# Patient Record
Sex: Female | Born: 1990 | Race: Black or African American | Hispanic: No | Marital: Single | Smoking: Current every day smoker
Health system: Southern US, Community
[De-identification: ages and names within clinical notes are randomized; demographics above are authoritative.]

## PROBLEM LIST (undated history)

## (undated) DIAGNOSIS — S42302A Unspecified fracture of shaft of humerus, left arm, initial encounter for closed fracture: Secondary | ICD-10-CM

## (undated) HISTORY — PX: NO PAST SURGERIES: SHX2092

---

## 2016-10-01 DIAGNOSIS — S42302A Unspecified fracture of shaft of humerus, left arm, initial encounter for closed fracture: Secondary | ICD-10-CM

## 2016-10-01 HISTORY — DX: Unspecified fracture of shaft of humerus, left arm, initial encounter for closed fracture: S42.302A

## 2016-10-13 ENCOUNTER — Emergency Department (HOSPITAL_COMMUNITY)
Admission: EM | Admit: 2016-10-13 | Discharge: 2016-10-13 | Disposition: A | Discharge status: Home / Self Care | Payer: Self-pay | Attending: Emergency Medicine | Admitting: Emergency Medicine

## 2016-10-13 ENCOUNTER — Emergency Department (HOSPITAL_COMMUNITY): Payer: Self-pay

## 2016-10-13 ENCOUNTER — Encounter (HOSPITAL_COMMUNITY): Payer: Self-pay | Admitting: *Deleted

## 2016-10-13 DIAGNOSIS — S42302A Unspecified fracture of shaft of humerus, left arm, initial encounter for closed fracture: Secondary | ICD-10-CM | POA: Insufficient documentation

## 2016-10-13 DIAGNOSIS — F17200 Nicotine dependence, unspecified, uncomplicated: Secondary | ICD-10-CM | POA: Insufficient documentation

## 2016-10-13 DIAGNOSIS — Y939 Activity, unspecified: Secondary | ICD-10-CM | POA: Insufficient documentation

## 2016-10-13 DIAGNOSIS — W19XXXA Unspecified fall, initial encounter: Secondary | ICD-10-CM | POA: Insufficient documentation

## 2016-10-13 DIAGNOSIS — Y999 Unspecified external cause status: Secondary | ICD-10-CM | POA: Insufficient documentation

## 2016-10-13 DIAGNOSIS — Y92009 Unspecified place in unspecified non-institutional (private) residence as the place of occurrence of the external cause: Secondary | ICD-10-CM | POA: Insufficient documentation

## 2016-10-13 MED ORDER — OXYCODONE-ACETAMINOPHEN 5-325 MG PO TABS: 1.0000 | ORAL_TABLET | ORAL | 0 refills | Status: DC | PRN

## 2016-10-13 MED ORDER — HYDROMORPHONE HCL 1 MG/ML IJ SOLN
0.5000 mg | Freq: Once | INTRAMUSCULAR | Status: AC
  Administered 2016-10-13: 0.5 mg via INTRAMUSCULAR
  Filled 2016-10-13: qty 1

## 2016-10-13 MED ORDER — HYDROMORPHONE HCL 1 MG/ML IJ SOLN
1.0000 mg | Freq: Once | INTRAMUSCULAR | Status: AC
  Administered 2016-10-13: 1 mg via INTRAMUSCULAR
  Filled 2016-10-13: qty 1

## 2016-10-13 NOTE — Progress Notes (Signed)
Patient ID: Caitlin Reyes, female   DOB: 1990-08-02, 26 y.o.   MRN: [id number] Called to evaluate left distal 1/3 Holstein Lewis fracture   The fracture is beyond the scope of my practice for definitive fixation   According to Dr. Adriana Simasook neuro exam is normal within the parameters of the pain she is in and the exam he can perform   I recommended [facility] for [facility]   I am available for further advise if needed

## 2016-10-13 NOTE — Discharge Instructions (Signed)
Casting material, sling, ice pack, pain medication, follow-up with orthopedic doctor in the morning at 815a. Address and phone number given.

## 2016-10-13 NOTE — ED Triage Notes (Signed)
Fell at home, pain in left upper arm

## 2016-10-15 NOTE — ED Provider Notes (Signed)
[facility] DEPT Provider Note   CSN: [id number] Arrival date & time: 10/13/16  1701     History   Chief Complaint Chief Complaint  Patient presents with  . Arm Injury    HPI Caitlin Reyes is a 26 y.o. female.  Status post accidental trip and fall a brief time ago at home striking her left distal humerus. No other injuries. Severity of pain is moderate to severe. Position palpation make pain worse.      History reviewed. No pertinent past medical history.  There are no active problems to display for this patient.   History reviewed. No pertinent surgical history.  OB History    Gravida Para Term Preterm AB Living   1             SAB TAB Ectopic Multiple Live Births                   Home Medications    Prior to Admission medications   Medication Sig Start Date End Date Taking? Authorizing Provider  oxyCODONE-acetaminophen (PERCOCET) 5-325 MG tablet Take 1-2 tablets by mouth every 4 (four) hours as needed. 10/13/16   Donnetta Hutching, MD  oxyCODONE-acetaminophen (PERCOCET) 5-325 MG tablet Take 1-2 tablets by mouth every 4 (four) hours as needed. 10/13/16   Donnetta Hutching, MD    Family History No family history on file.  Social History Social History  Substance Use Topics  . Smoking status: Current Every Day Smoker  . Smokeless tobacco: Never Used  . Alcohol use Yes     Allergies   Patient has no known allergies.   Review of Systems Review of Systems  All other systems reviewed and are negative.    Physical Exam Updated Vital Signs BP 130/87 (BP Location: Right Arm)   Pulse (!) 103   Temp 98.9 F (37.2 C) (Oral)   Resp 18   Ht  (1.626 m)   Wt 57.6 kg (127 lb)   LMP 09/30/2016 (Approximate)   SpO2 98%   BMI 21.80 kg/m   Physical Exam  Constitutional: She is oriented to person, place, and time. She appears well-developed and well-nourished.  HENT:  Head: Normocephalic and atraumatic.  Eyes: Conjunctivae are normal.  Neck: Neck supple.   Cardiovascular: Normal rate and regular rhythm.   Pulmonary/Chest: Effort normal and breath sounds normal.  Abdominal: Soft. Bowel sounds are normal.  Musculoskeletal:  Tender left distal humerus  Neurological: She is alert and oriented to person, place, and time.  Skin: Skin is warm and dry.  Psychiatric: She has a normal mood and affect. Her behavior is normal.  Nursing note and vitals reviewed.    ED Treatments / Results  Labs (all labs ordered are listed, but only abnormal results are displayed) Labs Reviewed - No data to display  EKG  EKG Interpretation None       Radiology Dg Humerus Left  Result Date: 10/13/2016 CLINICAL DATA:  Patient fell and landed on left humerus.  Pain. EXAM: LEFT HUMERUS - 2+ VIEW COMPARISON:  None. FINDINGS: There is an acute, closed, oblique fracture of the left humerus at the junction of the middle and distal third. There is 1 shaft width lateral displacement of the distal fracture fragment on the AP view with lateral angulation seen on the additional view provide. A transthoracic view is also provided which is of limited utility. No malalignment of the included AC and glenohumeral joint. The elbow joint is maintained as well. IMPRESSION: Acute, closed oblique  fracture of the left humerus at junction of the middle and distal third with lateral displacement on the AP view and lateral angulation on the additional images provided. Electronically Signed   By: Tollie Eth M.D.   On: 10/13/2016 19:03    Procedures Procedures (including critical care time)  Medications Ordered in ED Medications  HYDROmorphone (DILAUDID) injection 0.5 mg (0.5 mg Intramuscular Given 10/13/16 1809)  HYDROmorphone (DILAUDID) injection 1 mg (1 mg Intramuscular Given 10/13/16 2058)     Initial Impression / Assessment and Plan / ED Course  I have reviewed the triage vital signs and the nursing notes.  Pertinent labs & imaging results that were available during my care of  the patient were reviewed by me and considered in my medical decision making (see chart for details).     Patient has a displaced oblique fracture of the left distal humerus. Discussed with orthopedic surgeon in [place]. Will splint, treat pain;  patient will be reevaluated in the morning by orthopedic surgery  Final Clinical Impressions(s) / ED Diagnoses   Final diagnoses:  Closed fracture of shaft of left humerus, unspecified fracture morphology, initial encounter    New Prescriptions Discharge Medication List as of 10/13/2016  9:31 PM    START taking these medications   Details  !! oxyCODONE-acetaminophen (PERCOCET) 5-325 MG tablet Take 1-2 tablets by mouth every 4 (four) hours as needed., Starting Thu 10/13/2016, Print    !! oxyCODONE-acetaminophen (PERCOCET) 5-325 MG tablet Take 1-2 tablets by mouth every 4 (four) hours as needed., Starting Thu 10/13/2016, Print     !! - Potential duplicate medications found. Please discuss with provider.       Donnetta Hutching, MD 10/15/16 1137

## 2016-10-17 NOTE — H&P (Signed)
PREOPERATIVE H&P  Chief Complaint: left humeral shaft fracture  HPI: Caitlin Reyes is a 26 y.o. female who presents for preoperative history and physical with a diagnosis of left humeral shaft fracture. Symptoms are rated as moderate to severe, and have been worsening.  This is significantly impairing activities of daily living.  She has elected for surgical management.   No past medical history on file. No past surgical history on file. Social History   Social History  . Marital status: Single    Spouse name: N/A  . Number of children: N/A  . Years of education: N/A   Social History Main Topics  . Smoking status: Current Every Day Smoker  . Smokeless tobacco: Never Used  . Alcohol use Yes  . Drug use: No  . Sexual activity: Not on file   Other Topics Concern  . Not on file   Social History Narrative  . No narrative on file   No family history on file. No Known Allergies Prior to Admission medications   Medication Sig Start Date End Date Taking? Authorizing Provider  oxyCODONE-acetaminophen (PERCOCET) 5-325 MG tablet Take 1-2 tablets by mouth every 4 (four) hours as needed. 10/13/16   Donnetta Hutching, MD  oxyCODONE-acetaminophen (PERCOCET) 5-325 MG tablet Take 1-2 tablets by mouth every 4 (four) hours as needed. 10/13/16   Donnetta Hutching, MD     Positive ROS: All other systems have been reviewed and were otherwise negative with the exception of those mentioned in the HPI and as above.  Physical Exam: General: Alert, no acute distress Cardiovascular: No pedal edema Respiratory: No cyanosis, no use of accessory musculature GI: No organomegaly, abdomen is soft and non-tender Skin: No lesions in the area of chief complaint Neurologic: Sensation intact distally Psychiatric: Patient is competent for consent with normal mood and affect Lymphatic: No axillary or cervical lymphadenopathy  MUSCULOSKELETAL: Splint CDI. Skin intact though cannot assess fully beneath splint. Nontender to  palpation proximally, with full and painless ROM throughout hand with DPC of 0. + Motor in  AIN, PIN, Ulnar distributions. Sensation intact in medial, radial, and ulnar distributions. Well perfused digits.    Assessment: left humeral shaft fracture  Plan: Plan for Procedure(s): OPEN REDUCTION INTERNAL FIXATION (ORIF) HUMERAL SHAFT FRACTURE  The risks benefits and alternatives were discussed with the patient including but not limited to the risks of nonoperative treatment, versus surgical intervention including infection, bleeding, nerve injury,  blood clots, cardiopulmonary complications, morbidity, mortality, among others, and they were willing to proceed.   Bjorn Pippin, MD  10/17/2016 8:31 AM

## 2016-10-18 ENCOUNTER — Encounter (HOSPITAL_BASED_OUTPATIENT_CLINIC_OR_DEPARTMENT_OTHER): Payer: Self-pay | Admitting: *Deleted

## 2016-10-19 ENCOUNTER — Ambulatory Visit (HOSPITAL_BASED_OUTPATIENT_CLINIC_OR_DEPARTMENT_OTHER)
Admission: RE | Admit: 2016-10-19 | Discharge: 2016-10-19 | Disposition: A | Discharge status: Home / Self Care | Payer: Self-pay | Source: Ambulatory Visit | Attending: Orthopaedic Surgery | Admitting: Orthopaedic Surgery

## 2016-10-19 ENCOUNTER — Ambulatory Visit (HOSPITAL_BASED_OUTPATIENT_CLINIC_OR_DEPARTMENT_OTHER): Payer: Self-pay | Admitting: Certified Registered"

## 2016-10-19 ENCOUNTER — Encounter (HOSPITAL_BASED_OUTPATIENT_CLINIC_OR_DEPARTMENT_OTHER)
Admission: RE | Disposition: A | Discharge status: Home / Self Care | Payer: Self-pay | Source: Ambulatory Visit | Attending: Orthopaedic Surgery

## 2016-10-19 ENCOUNTER — Encounter (HOSPITAL_BASED_OUTPATIENT_CLINIC_OR_DEPARTMENT_OTHER): Payer: Self-pay | Admitting: Certified Registered"

## 2016-10-19 DIAGNOSIS — W109XXA Fall (on) (from) unspecified stairs and steps, initial encounter: Secondary | ICD-10-CM | POA: Insufficient documentation

## 2016-10-19 DIAGNOSIS — S42412A Displaced simple supracondylar fracture without intercondylar fracture of left humerus, initial encounter for closed fracture: Secondary | ICD-10-CM | POA: Insufficient documentation

## 2016-10-19 DIAGNOSIS — G5632 Lesion of radial nerve, left upper limb: Secondary | ICD-10-CM | POA: Insufficient documentation

## 2016-10-19 DIAGNOSIS — F17200 Nicotine dependence, unspecified, uncomplicated: Secondary | ICD-10-CM | POA: Insufficient documentation

## 2016-10-19 HISTORY — PX: ORIF HUMERUS FRACTURE: SHX2126

## 2016-10-19 HISTORY — DX: Unspecified fracture of shaft of humerus, left arm, initial encounter for closed fracture: S42.302A

## 2016-10-19 SURGERY — OPEN REDUCTION INTERNAL FIXATION (ORIF) HUMERAL SHAFT FRACTURE
Anesthesia: General | Site: Arm Upper | Laterality: Left

## 2016-10-19 MED ORDER — OXYCODONE HCL 5 MG PO TABS: ORAL_TABLET | ORAL | 0 refills | Status: AC

## 2016-10-19 MED ORDER — CEFAZOLIN SODIUM-DEXTROSE 2-4 GM/100ML-% IV SOLN
INTRAVENOUS | Status: AC
  Filled 2016-10-19: qty 100

## 2016-10-19 MED ORDER — OXYCODONE HCL 5 MG/5ML PO SOLN: 5.0000 mg | Freq: Once | ORAL | Status: DC | PRN

## 2016-10-19 MED ORDER — CHLORHEXIDINE GLUCONATE 4 % EX LIQD: 60.0000 mL | Freq: Once | CUTANEOUS | Status: DC

## 2016-10-19 MED ORDER — MIDAZOLAM HCL 2 MG/2ML IJ SOLN
INTRAMUSCULAR | Status: AC
  Filled 2016-10-19: qty 2

## 2016-10-19 MED ORDER — MEPERIDINE HCL 25 MG/ML IJ SOLN: 6.2500 mg | INTRAMUSCULAR | Status: DC | PRN

## 2016-10-19 MED ORDER — FENTANYL CITRATE (PF) 100 MCG/2ML IJ SOLN
INTRAMUSCULAR | Status: AC
  Filled 2016-10-19: qty 2

## 2016-10-19 MED ORDER — BUPIVACAINE-EPINEPHRINE (PF) 0.5% -1:200000 IJ SOLN
INTRAMUSCULAR | Status: DC | PRN
  Administered 2016-10-19: 30 mL via PERINEURAL

## 2016-10-19 MED ORDER — LIDOCAINE HCL (CARDIAC) 20 MG/ML IV SOLN
INTRAVENOUS | Status: DC | PRN
  Administered 2016-10-19: 60 mg via INTRAVENOUS

## 2016-10-19 MED ORDER — SCOPOLAMINE 1 MG/3DAYS TD PT72: 1.0000 | MEDICATED_PATCH | Freq: Once | TRANSDERMAL | Status: DC | PRN

## 2016-10-19 MED ORDER — ROCURONIUM BROMIDE 100 MG/10ML IV SOLN
INTRAVENOUS | Status: DC | PRN
  Administered 2016-10-19: 20 mg via INTRAVENOUS
  Administered 2016-10-19: 50 mg via INTRAVENOUS

## 2016-10-19 MED ORDER — DEXAMETHASONE SODIUM PHOSPHATE 4 MG/ML IJ SOLN
INTRAMUSCULAR | Status: DC | PRN
  Administered 2016-10-19: 10 mg via INTRAVENOUS

## 2016-10-19 MED ORDER — DEXAMETHASONE SODIUM PHOSPHATE 10 MG/ML IJ SOLN
INTRAMUSCULAR | Status: AC
  Filled 2016-10-19: qty 2

## 2016-10-19 MED ORDER — BUPIVACAINE-EPINEPHRINE (PF) 0.5% -1:200000 IJ SOLN
INTRAMUSCULAR | Status: AC
  Filled 2016-10-19: qty 30

## 2016-10-19 MED ORDER — PROPOFOL 10 MG/ML IV BOLUS
INTRAVENOUS | Status: DC | PRN
  Administered 2016-10-19: 200 mg via INTRAVENOUS

## 2016-10-19 MED ORDER — ONDANSETRON HCL 4 MG PO TABS: 4.0000 mg | ORAL_TABLET | Freq: Three times a day (TID) | ORAL | 1 refills | Status: AC | PRN

## 2016-10-19 MED ORDER — BUPIVACAINE HCL (PF) 0.5 % IJ SOLN
INTRAMUSCULAR | Status: AC
  Filled 2016-10-19: qty 30

## 2016-10-19 MED ORDER — HYDROMORPHONE HCL 1 MG/ML IJ SOLN
0.2500 mg | INTRAMUSCULAR | Status: DC | PRN
  Administered 2016-10-19: 0.5 mg via INTRAVENOUS

## 2016-10-19 MED ORDER — BUPIVACAINE HCL (PF) 0.5 % IJ SOLN
INTRAMUSCULAR | Status: DC | PRN
  Administered 2016-10-19: 20 mL

## 2016-10-19 MED ORDER — NAPROXEN 250 MG PO TABS: 250.0000 mg | ORAL_TABLET | Freq: Two times a day (BID) | ORAL | 0 refills | Status: AC

## 2016-10-19 MED ORDER — OXYCODONE HCL 5 MG PO TABS: 5.0000 mg | ORAL_TABLET | Freq: Once | ORAL | Status: DC | PRN

## 2016-10-19 MED ORDER — FENTANYL CITRATE (PF) 100 MCG/2ML IJ SOLN
50.0000 ug | INTRAMUSCULAR | Status: AC | PRN
  Administered 2016-10-19 (×2): 50 ug via INTRAVENOUS
  Administered 2016-10-19: 100 ug via INTRAVENOUS

## 2016-10-19 MED ORDER — ONDANSETRON HCL 4 MG/2ML IJ SOLN
INTRAMUSCULAR | Status: AC
  Filled 2016-10-19: qty 10

## 2016-10-19 MED ORDER — MIDAZOLAM HCL 2 MG/2ML IJ SOLN
2.0000 mg | Freq: Once | INTRAMUSCULAR | Status: AC
  Administered 2016-10-19: 2 mg via INTRAVENOUS

## 2016-10-19 MED ORDER — BISACODYL 5 MG PO TBEC: 5.0000 mg | DELAYED_RELEASE_TABLET | Freq: Every day | ORAL | 0 refills | Status: AC | PRN

## 2016-10-19 MED ORDER — MIDAZOLAM HCL 2 MG/2ML IJ SOLN
1.0000 mg | INTRAMUSCULAR | Status: DC | PRN
  Administered 2016-10-19: 2 mg via INTRAVENOUS

## 2016-10-19 MED ORDER — PROMETHAZINE HCL 25 MG/ML IJ SOLN: 6.2500 mg | INTRAMUSCULAR | Status: DC | PRN

## 2016-10-19 MED ORDER — HYDROMORPHONE HCL 1 MG/ML IJ SOLN
INTRAMUSCULAR | Status: AC
  Filled 2016-10-19: qty 0.5

## 2016-10-19 MED ORDER — LACTATED RINGERS IV SOLN
INTRAVENOUS | Status: DC
  Administered 2016-10-19 (×2): via INTRAVENOUS

## 2016-10-19 MED ORDER — OMEPRAZOLE 20 MG PO CPDR: 20.0000 mg | DELAYED_RELEASE_CAPSULE | Freq: Every day | ORAL | 0 refills | Status: DC

## 2016-10-19 MED ORDER — ONDANSETRON HCL 4 MG/2ML IJ SOLN
INTRAMUSCULAR | Status: DC | PRN
  Administered 2016-10-19: 4 mg via INTRAVENOUS

## 2016-10-19 MED ORDER — CEFAZOLIN SODIUM-DEXTROSE 2-4 GM/100ML-% IV SOLN
2.0000 g | INTRAVENOUS | Status: AC
  Administered 2016-10-19: 2 g via INTRAVENOUS

## 2016-10-19 MED ORDER — PROPOFOL 500 MG/50ML IV EMUL
INTRAVENOUS | Status: AC
  Filled 2016-10-19: qty 50

## 2016-10-19 MED ORDER — LIDOCAINE 2% (20 MG/ML) 5 ML SYRINGE
INTRAMUSCULAR | Status: AC
  Filled 2016-10-19: qty 15

## 2016-10-19 MED ORDER — SUGAMMADEX SODIUM 500 MG/5ML IV SOLN
INTRAVENOUS | Status: DC | PRN
  Administered 2016-10-19: 250 mg via INTRAVENOUS

## 2016-10-19 MED ORDER — ACETAMINOPHEN 500 MG PO TABS: 1000.0000 mg | ORAL_TABLET | Freq: Three times a day (TID) | ORAL | 0 refills | Status: AC

## 2016-10-19 SURGICAL SUPPLY — 78 items
BANDAGE ACE 4X5 VEL STRL LF (GAUZE/BANDAGES/DRESSINGS) ×2 IMPLANT
BENZOIN TINCTURE PRP APPL 2/3 (GAUZE/BANDAGES/DRESSINGS) IMPLANT
BIT DRILL 2.5X110 QC LCP DISP (BIT) ×4 IMPLANT
BIT DRILL QC 3.5X110 (BIT) ×2 IMPLANT
BLADE CLIPPER SURG (BLADE) IMPLANT
BLADE SURG 15 STRL LF DISP TIS (BLADE) ×1 IMPLANT
BLADE SURG 15 STRL SS (BLADE) ×1
BNDG ESMARK 4X9 LF (GAUZE/BANDAGES/DRESSINGS) ×2 IMPLANT
CHLORAPREP W/TINT 26ML (MISCELLANEOUS) ×4 IMPLANT
COVER BACK TABLE 60X90IN (DRAPES) IMPLANT
COVER MAYO STAND STRL (DRAPES) ×4 IMPLANT
COVER SURGICAL LIGHT HANDLE (MISCELLANEOUS) ×2 IMPLANT
DECANTER SPIKE VIAL GLASS SM (MISCELLANEOUS) IMPLANT
DRAPE C-ARM 42X72 X-RAY (DRAPES) IMPLANT
DRAPE EXTREMITY T 121X128X90 (DRAPE) ×2 IMPLANT
DRAPE IMP U-DRAPE 54X76 (DRAPES) IMPLANT
DRAPE INCISE IOBAN 66X45 STRL (DRAPES) ×6 IMPLANT
DRAPE OEC MINIVIEW 54X84 (DRAPES) ×2 IMPLANT
DRAPE U-SHAPE 47X51 STRL (DRAPES) ×2 IMPLANT
DRAPE U-SHAPE 76X120 STRL (DRAPES) IMPLANT
ELECT REM PT RETURN 9FT ADLT (ELECTROSURGICAL) ×2
ELECTRODE REM PT RTRN 9FT ADLT (ELECTROSURGICAL) ×1 IMPLANT
GAUZE SPONGE 4X4 12PLY STRL (GAUZE/BANDAGES/DRESSINGS) IMPLANT
GAUZE XEROFORM 1X8 LF (GAUZE/BANDAGES/DRESSINGS) IMPLANT
GAUZE XEROFORM 5X9 LF (GAUZE/BANDAGES/DRESSINGS) ×2 IMPLANT
GLOVE BIOGEL PI IND STRL 7.0 (GLOVE) ×2 IMPLANT
GLOVE BIOGEL PI IND STRL 8 (GLOVE) ×2 IMPLANT
GLOVE BIOGEL PI INDICATOR 7.0 (GLOVE) ×2
GLOVE BIOGEL PI INDICATOR 8 (GLOVE) ×2
GLOVE ECLIPSE 6.5 STRL STRAW (GLOVE) ×4 IMPLANT
GLOVE SURG ORTHO 8.0 STRL STRW (GLOVE) ×2 IMPLANT
GOWN STRL REUS W/ TWL LRG LVL3 (GOWN DISPOSABLE) ×2 IMPLANT
GOWN STRL REUS W/TWL LRG LVL3 (GOWN DISPOSABLE) ×2
GOWN STRL REUS W/TWL XL LVL3 (GOWN DISPOSABLE) ×2 IMPLANT
NS IRRIG 1000ML POUR BTL (IV SOLUTION) ×2 IMPLANT
PACK ARTHROSCOPY DSU (CUSTOM PROCEDURE TRAY) ×2 IMPLANT
PACK BASIN DAY SURGERY FS (CUSTOM PROCEDURE TRAY) ×2 IMPLANT
PAD CAST 4YDX4 CTTN HI CHSV (CAST SUPPLIES) ×2 IMPLANT
PADDING CAST COTTON 4X4 STRL (CAST SUPPLIES) ×2
PENCIL BUTTON HOLSTER BLD 10FT (ELECTRODE) ×2 IMPLANT
PLATE DIS HUM LT 3.5 8H STRL (Plate) ×2 IMPLANT
SCREW CORTEX 3.5 20MM (Screw) ×2 IMPLANT
SCREW CORTEX 3.5 22MM (Screw) ×1 IMPLANT
SCREW CORTEX 3.5 24MM (Screw) ×3 IMPLANT
SCREW LOCK CORT ST 3.5X20 (Screw) ×2 IMPLANT
SCREW LOCK CORT ST 3.5X22 (Screw) ×1 IMPLANT
SCREW LOCK CORT ST 3.5X24 (Screw) ×3 IMPLANT
SCREW LOCK T15 FT 18X3.5X2.9X (Screw) ×1 IMPLANT
SCREW LOCK T15 FT 28X3.5X2.9X (Screw) ×2 IMPLANT
SCREW LOCKING 3.5X18 (Screw) ×1 IMPLANT
SCREW LOCKING 3.5X28 (Screw) ×2 IMPLANT
SLEEVE SCD COMPRESS KNEE MED (MISCELLANEOUS) ×2 IMPLANT
SLING ARM FOAM STRAP LRG (SOFTGOODS) ×2 IMPLANT
SLING ARM IMMOBILIZER LRG (SOFTGOODS) IMPLANT
SLING ARM IMMOBILIZER MED (SOFTGOODS) IMPLANT
SLING ARM MED ADULT FOAM STRAP (SOFTGOODS) IMPLANT
SLING ARM XL FOAM STRAP (SOFTGOODS) IMPLANT
SPLINT FAST PLASTER 5X30 (CAST SUPPLIES) ×10
SPLINT PLASTER CAST FAST 5X30 (CAST SUPPLIES) ×10 IMPLANT
SPONGE LAP 18X18 X RAY DECT (DISPOSABLE) ×4 IMPLANT
STAPLER VISISTAT 35W (STAPLE) ×2 IMPLANT
STRIP CLOSURE SKIN 1/2X4 (GAUZE/BANDAGES/DRESSINGS) IMPLANT
SUCTION FRAZIER HANDLE 10FR (MISCELLANEOUS) ×1
SUCTION TUBE FRAZIER 10FR DISP (MISCELLANEOUS) ×1 IMPLANT
SUT ETHILON 3 0 PS 1 (SUTURE) IMPLANT
SUT FIBERWIRE #2 38 T-5 BLUE (SUTURE)
SUT VIC AB 0 CT1 27 (SUTURE) ×1
SUT VIC AB 0 CT1 27XBRD ANBCTR (SUTURE) ×1 IMPLANT
SUT VIC AB 2-0 SH 27 (SUTURE) ×1
SUT VIC AB 2-0 SH 27XBRD (SUTURE) ×1 IMPLANT
SUT VIC AB 3-0 SH 27 (SUTURE) ×1
SUT VIC AB 3-0 SH 27X BRD (SUTURE) ×1 IMPLANT
SUTURE FIBERWR #2 38 T-5 BLUE (SUTURE) IMPLANT
SYR BULB 3OZ (MISCELLANEOUS) IMPLANT
SYR BULB IRRIGATION 50ML (SYRINGE) ×4 IMPLANT
TOWEL OR 17X24 6PK STRL BLUE (TOWEL DISPOSABLE) ×2 IMPLANT
TOWEL OR NON WOVEN STRL DISP B (DISPOSABLE) ×2 IMPLANT
YANKAUER SUCT BULB TIP NO VENT (SUCTIONS) ×2 IMPLANT

## 2016-10-19 NOTE — Transfer of Care (Signed)
Immediate Anesthesia Transfer of Care Note  Patient: Caitlin Reyes  Procedure(s) Performed: Procedure(s): OPEN REDUCTION INTERNAL FIXATION (ORIF) HUMERAL SHAFT FRACTURE (Left)  Patient Location: PACU  Anesthesia Type:General  Level of Consciousness: awake, alert , oriented and patient cooperative  Airway & Oxygen Therapy: Patient Spontanous Breathing and Patient connected to face mask oxygen  Post-op Assessment: Report given to RN and Post -op Vital signs reviewed and stable  Post vital signs: Reviewed and stable  Last Vitals:  Vitals:   10/19/16 1045 10/19/16 1046  BP:  (!) 143/89  Pulse: (!) 101 (!) 106  Resp:  (!) 21  Temp:    SpO2: 100% 100%    Last Pain:  Vitals:   10/19/16 0705  TempSrc: Oral  PainSc: 0-No pain         Complications: No apparent anesthesia complications

## 2016-10-19 NOTE — Progress Notes (Signed)
Assisted Dr. Germeroth with left, ultrasound guided, supraclavicular block. Side rails up, monitors on throughout procedure. See vital signs in flow sheet. Tolerated Procedure well. 

## 2016-10-19 NOTE — Anesthesia Postprocedure Evaluation (Signed)
Anesthesia Post Note  Patient: Caitlin Reyes  Procedure(s) Performed: Procedure(s) (LRB): OPEN REDUCTION INTERNAL FIXATION (ORIF) HUMERAL SHAFT FRACTURE (Left)     Patient location during evaluation: PACU Anesthesia Type: General Level of consciousness: sedated and patient cooperative Pain management: pain level controlled Vital Signs Assessment: post-procedure vital signs reviewed and stable Respiratory status: spontaneous breathing Cardiovascular status: stable Anesthetic complications: no Comments: Post op block in PACU    Last Vitals:  Vitals:   10/19/16 1218 10/19/16 1225  BP: (!) 174/115 (!) 174/109  Pulse: 99   Resp: 18   Temp: 36.8 C   SpO2: 100%     Last Pain:  Vitals:   10/19/16 1218  TempSrc:   PainSc: 0-No pain                 Lewie Loron

## 2016-10-19 NOTE — Op Note (Addendum)
Orthopaedic Surgery Operative Note (CSN: [id number])  Caitlin Reyes  24-Sep-1990 Date of Surgery: 10/19/2016 Admit Date: 10/19/2016  Diagnoses:  Spiral supracondylar humerus fracture    Post-Op Diagnosis: Same  Procedures:   * OPEN REDUCTION INTERNAL FIXATION (ORIF) HUMERAL SHAFT FRACTURE 24545    * RADIAL NERVE NEUROLYSIS CPT 64748   Operative Finding Successful completion of planned procedure. Fracture was at the level of the radial nerve and this was neurolysed proximally and distally to free it of tether. Lag screws were placed as well as a neutralization plate due to the long spiral nature of the fracture  Post-operative plan: The patient will be nonweightbearing in the splint until her follow-up.  The patient will be discharged home.  DVT prophylaxis is not indicated in this angulatory patient with isolated upper trauma surgery.  Pain control with PRN pain medication preferring oral medicines.  Follow up plan will be scheduled in approximately 10-14 days for wound check .  Surgeons:Primary: Bjorn Pippin, MD  Assist: Janace Litten OPA Location: [facility] OR ROOM 5 Anesthesia: General Antibiotics: Ancef 2g preop Tourniquet time:  Total Tourniquet Time Documented: Upper Arm (Left) - 22 minutes Total: Upper Arm (Left) - 22 minutes  Estimated Blood Loss: Approximately 200 Complications: None Specimens: None Implants:  Implant Name Type Inv. Item Serial No. Manufacturer Lot No. LRB No. Used Action  3.5 LCP extra articular distal humerus plate    [employer] TRAUMA [id number] Left 1 Implanted  SCREW LOCKING 3.5X28 - [id number] Screw SCREW LOCKING 3.5X28  SYN[employer] TRAUMA ON STERILE TRAY Left 2 Implanted  SCREW CORTEX 3.5 - [id number] Screw SCREW CORTEX 3.5  SYNTHES TRAUMA ON STERILE TRAY Left 1 Implanted  SCREW LOCKING 3.5X18 - [id number] Screw SCREW LOCKING 3.5X18  SYNTHES TRAUMA ON STERILE TRAY Left 1 Implanted  SCREW CORTEX 3.5 - [id number] Screw SCREW CORTEX 3.5  SYNTHES  TRAUMA ON STERILE TRAY Left 2 Implanted  SCREW CORTEX 3.5 - [id number] Screw SCREW CORTEX 3.5   SYNTHES TRAUMA ON STERILE TRAY Left 3 Implanted    Indications for Surgery:   Caitlin Reyes is a 26 y.o. female with Fall down stairs resulting in an oblique spiral displaced left supracondylar humerus fracture.  This had a resultant radial nerve palsy though incomplete. Due to the displaced nature of this fracture in this otherwise healthy patient discussed surgical management versus nonsurgical management and she elected to proceed with surgery. Benefits and risks of operative and nonoperative management were discussed prior to surgery with patient/guardian(s) and informed consent form was completed.     Procedure:   The patient was identified in the preoperative holding area where the surgical site was marked. The patient was taken to the OR where a procedural timeout was called and the above noted anesthesia was induced.  Preoperative antibiotics were dosed.  Patient was positioned lateral on a beanbag with the arm over a bolster. The patient's left humerus was prepped and draped in the usual sterile fashion.  A second preoperative timeout was called.      A tourniquet was used for the above listed time.   We initially proceeded with a sterile tourniquet in place but removed it during our initial dissection. We made a longitudinal incision down the midline of the posterior aspect of the humerus achieving hemostasis as we progressed with Bovie electrocautery. We encountered the fascia over the triceps and this is open. We then identified the lateral border of the triceps as it inserted to the olecranon  followed this proximally mobilizing the triceps without having to release its distal insertion. We're able to use this window and dissected carefully identifying the fracture.  At the level of the fracture the radial nerve was crossing from medial to lateral on the posterior aspect of the  humerus. This was accompanied by the artery as well. We took great care to protect the structures using [employer] retractors and gently mobilized it. A neurolysis was performed proximal and distally to allow the nerve to be free for further fixation of the fracture.  We then turned our attention fracture fixation. Callus was debrided from the fracture location and we were able to achieve an anatomic reduction and hold it in place provisionally with point when clamps. This point fluoroscopic images were used to gauge our reduction. We then placed 2 screws in lag by technique fashion across the fracture site and again checked our reduction. Once were happy with the reduction we assessed her fracture for a neutralization plate. Due to the long oblique nature of the fracture it was determined that a extra articular distal humerus plate from [employer] was the appropriate fixation. We positioned the plate by carefully sliding it under the neurovascular bundle against the bone and checked its location with fluoroscopy for obtaining for distal screws with bicortical purchase and 3 proximal screws bicortical purchase.  The screws were noted to be out of the zone of injury of the bone.  We then again checked the status of the radial nerve and artery and noted that there were both intact at the completion of the case.  The wound was thoroughly irrigated.  The wound was closed in a multilayer fashion. A sterile dressing and splint were placed.  The patient was awoken from general anesthesia and taken to the PACU in stable condition without complication.   Janace Litten, OPA-C, present and scrubbed throughout the case, critical for completion in a timely fashion, and for retraction, instrumentation, closure.

## 2016-10-19 NOTE — Interval H&P Note (Signed)
History and Physical Interval Note:  10/19/2016 7:58 AM  Caitlin Reyes  has presented today for surgery, with the diagnosis of left humeral shaft fracture  The various methods of treatment have been discussed with the patient and family. After consideration of risks, benefits and other options for treatment, the patient has consented to  Procedure(s): OPEN REDUCTION INTERNAL FIXATION (ORIF) HUMERAL SHAFT FRACTURE (Left) as a surgical intervention .  The patient's history has been reviewed, patient examined, no change in status, stable for surgery.  I have reviewed the patient's chart and labs.  Questions were answered to the patient's satisfaction.    Patient continues to have some parasthesias in the radial nerve distribution.  We discussed avoiding a block in this setting with anesthesia.  Additionally we warned the patient that her radial nerve parasthesias may be increased postop.  She expressed understanding.   Bjorn Pippin

## 2016-10-19 NOTE — Anesthesia Procedure Notes (Signed)
Procedure Name: Intubation Date/Time: 10/19/2016 8:24 AM Performed by: Victor Langenbach D Pre-anesthesia Checklist: Patient identified, Emergency Drugs available, Suction available and Patient being monitored Patient Re-evaluated:Patient Re-evaluated prior to induction Oxygen Delivery Method: Circle system utilized Preoxygenation: Pre-oxygenation with 100% oxygen Induction Type: IV induction Ventilation: Mask ventilation without difficulty Laryngoscope Size: Mac and 3 Grade View: Grade I Tube type: Oral Number of attempts: 1 Airway Equipment and Method: Stylet and Oral airway Placement Confirmation: ETT inserted through vocal cords under direct vision,  positive ETCO2 and breath sounds checked- equal and bilateral Secured at: 21 cm Tube secured with: Tape Dental Injury: Teeth and Oropharynx as per pre-operative assessment

## 2016-10-19 NOTE — Anesthesia Procedure Notes (Signed)
Anesthesia Regional Block: Supraclavicular block   Pre-Anesthetic Checklist: ,, timeout performed, Correct Patient, Correct Site, Correct Laterality, Correct Procedure, Correct Position, site marked, Risks and benefits discussed,  Surgical consent,  Pre-op evaluation,  At surgeon's request and post-op pain management  Laterality: Left  Prep: chloraprep       Needles:  Injection technique: Single-shot  Needle Type: Stimiplex          Additional Needles:   Procedures:,,,, ultrasound used (permanent image in chart),,,,  Narrative:  Start time: 10/19/2016 11:13 AM End time: 10/19/2016 11:23 AM Injection made incrementally with aspirations every 5 mL.  Performed by: Personally  Anesthesiologist: Lewie Loron  Additional Notes: Dr. Everardo Pacific request no preop block to allow for evaluation of neurologic status in PACU. Discussed post op block with patient. Risks, benefits and alternative to block explained extensively. Dr. Everardo Pacific at bedside in PACU to eval pt prior to block. She had gross motor function of her left thumb. No strength assessment, just able to move thumb to command. Dr. Everardo Pacific then requested post op block. Block done under direct ultrasound guidance. Good perineural spread. Patient tolerated procedure well, without complications.

## 2016-10-19 NOTE — Discharge Instructions (Signed)
Ramond Marrow MD, MPH [facility] [address], [address] [phone] (tel)   [phone] (fax)   POST-OPERATIVE INSTRUCTIONS   WOUND CARE ? Please keep splint clean dry and intact until followup.  ? You may shower on Post-Op Day #2. You must keep splint dry during this process and may find that a plastic bag taped around the leg or alternatively a towel based bath may be a better option.  If you get your splint wet or if it is damaged please contact our clinic.  EXERCISES ? Due to your splint being in place you will not be able to bear weight through your extremity.   Please use your sling until follow-up. ? Please continue to work on range of motion of your fingers and stretch these multiple times a day to prevent stiffness.  POST-OP MEDICINES ? A multi-modal approach will be used to treat your pain.  Oxycodone - This is a strong narcotic, to be used only on an as needed basis for pain.  Acetaminophen - A non-narcotic pain medicine.  Use  three times a day for the first 14 days after surgery    Zofran  - This is an anti-nausea medicine to be used only if you are having nausea or vomitting. ? If you have any adverse effects with the medications, please call our office.  FOLLOW-UP ? If you develop a Fever (?101.5), Redness or Drainage from the surgical incision site, please call our office to arrange for an evaluation. ? Please call the office to schedule a follow-up appointment for your suture removal, 10-14 days post-operatively.  IF YOU HAVE ANY QUESTIONS, PLEASE FEEL FREE TO CALL OUR OFFICE.  HELPFUL INFORMATION  ? Your arm will be in a sling following surgery. You will be in this sling for the next 4-6 weeks.  I will let you know the exact duration at your follow-up visit. You may walk and ambulate without restrictions.  ? You should wean off your narcotic medicines as soon as you are able.  Most patients will be off or using minimal  narcotics before their first postop appointment.    ? You may be more comfortable sleeping in a semi-seated position the first few nights following surgery.  Keep a pillow propped under the elbow and forearm for comfort.  If you have a recliner type of chair it might be beneficial.    ? We suggest you use the pain medication the first night prior to going to bed, in order to ease any pain when the anesthesia wears off. You should avoid taking pain medications on an empty stomach as it will make you nauseous.  ? Do not drink alcoholic beverages or take illicit drugs when taking pain medications.  ? In most states it is against the law to drive while your arm is in a sling. And certainly against the law to drive while taking narcotics.  ? You may return to work/school in the next couple of days when you feel up to it. Desk work and typing in the sling is fine.  ? When dressing, put your operative arm in the sleeve first.  When getting undressed, take your operative arm out last.  Loose fitting, button-down shirts are recommended.  Often in the first days after surgery you may be more comfortable keeping your operative arm under your shirt and not through the sleeve.  ? Pain medication may make you constipated.  Below are a few solutions to try in this  order: - Decrease the amount of pain medication if you arent having pain. - Drink lots of decaffeinated fluids. - Drink prune juice and/or each dried prunes  o If the first 3 dont work start with additional solutions - Take Colace - an over-the-counter stool softener - Take Senokot - an over-the-counter laxative - Take Miralax - a stronger over-the-counter laxative   Post Anesthesia Home Care Instructions  Activity: Get plenty of rest for the remainder of the day. A responsible individual must stay with you for 24 hours following the procedure.  For the next 24 hours, DO NOT: -Drive a car -Advertising copywriter -Drink alcoholic  beverages -Take any medication unless instructed by your physician -Make any legal decisions or sign important papers.  Meals: Start with liquid foods such as gelatin or soup. Progress to regular foods as tolerated. Avoid greasy, spicy, heavy foods. If nausea and/or vomiting occur, drink only clear liquids until the nausea and/or vomiting subsides. Call your physician if vomiting continues.  Special Instructions/Symptoms: Your throat may feel dry or sore from the anesthesia or the breathing tube placed in your throat during surgery. If this causes discomfort, gargle with warm salt water. The discomfort should disappear within 24 hours.  If you had a scopolamine patch placed behind your ear for the management of post- operative nausea and/or vomiting:  1. The medication in the patch is effective for 72 hours, after which it should be removed.  Wrap patch in a tissue and discard in the trash. Wash hands thoroughly with soap and water. 2. You may remove the patch earlier than 72 hours if you experience unpleasant side effects which may include dry mouth, dizziness or visual disturbances. 3. Avoid touching the patch. Wash your hands with soap and water after contact with the patch.  Regional Anesthesia Blocks  1. Numbness or the inability to move the "blocked" extremity may last from 3-48 hours after placement. The length of time depends on the medication injected and your individual response to the medication. If the numbness is not going away after 48 hours, call your surgeon.  2. The extremity that is blocked will need to be protected until the numbness is gone and the  Strength has returned. Because you cannot feel it, you will need to take extra care to avoid injury. Because it may be weak, you may have difficulty moving it or using it. You may not know what position it is in without looking at it while the block is in effect.  3. For blocks in the legs and feet, returning to weight bearing  and walking needs to be done carefully. You will need to wait until the numbness is entirely gone and the strength has returned. You should be able to move your leg and foot normally before you try and bear weight or walk. You will need someone to be with you when you first try to ensure you do not fall and possibly risk injury.  4. Bruising and tenderness at the needle site are common side effects and will resolve in a few days.  5. Persistent numbness or new problems with movement should be communicated to the surgeon or the [facility] [phone] [facility] [phone]).

## 2016-10-19 NOTE — Anesthesia Preprocedure Evaluation (Addendum)
Anesthesia Evaluation  Patient identified by MRN, date of birth, ID band Patient awake    Reviewed: Allergy & Precautions, NPO status , Patient's Chart, lab work & pertinent test results  Airway Mallampati: II  TM Distance: >3 FB Neck ROM: Full    Dental no notable dental hx.    Pulmonary Current Smoker,    Pulmonary exam normal breath sounds clear to auscultation       Cardiovascular negative cardio ROS Normal cardiovascular exam Rhythm:Regular Rate:Normal     Neuro/Psych negative neurological ROS  negative psych ROS   GI/Hepatic negative GI ROS, Neg liver ROS,   Endo/Other  negative endocrine ROS  Renal/GU negative Renal ROS     Musculoskeletal negative musculoskeletal ROS (+)   Abdominal   Peds  Hematology negative hematology ROS (+)   Anesthesia Other Findings   Reproductive/Obstetrics negative OB ROS                            Anesthesia Physical Anesthesia Plan  ASA: II  Anesthesia Plan: General   Post-op Pain Management:  Regional for Post-op pain   Induction: Intravenous  PONV Risk Score and Plan: 3 and Ondansetron, Dexamethasone and Midazolam  Airway Management Planned: Oral ETT  Additional Equipment:   Intra-op Plan:   Post-operative Plan: Extubation in OR  Informed Consent: I have reviewed the patients History and Physical, chart, labs and discussed the procedure including the risks, benefits and alternatives for the proposed anesthesia with the patient or authorized representative who has indicated his/her understanding and acceptance.   Dental advisory given  Plan Discussed with: CRNA  Anesthesia Plan Comments: (Discussed post op block with patient. Dr. Everardo Pacific request no preop block to allow for evaluation of neurologic status in PACU.   Dr. Everardo Pacific at bedside in PACU to eval pt prior to block. She had gross motor function of her left thumb. No strength  assessment, just able to move thumb to command. Dr. Everardo Pacific then requested post op block.)       Anesthesia Quick Evaluation

## 2016-10-20 MED FILL — Oxycodone w/ Acetaminophen Tab 5-325 MG: ORAL | Qty: 6 | Status: AC

## 2016-10-25 ENCOUNTER — Encounter (HOSPITAL_BASED_OUTPATIENT_CLINIC_OR_DEPARTMENT_OTHER): Payer: Self-pay | Admitting: Orthopaedic Surgery

## 2019-01-30 ENCOUNTER — Other Ambulatory Visit: Payer: Self-pay

## 2019-01-30 ENCOUNTER — Emergency Department (HOSPITAL_COMMUNITY): Payer: Self-pay

## 2019-01-30 ENCOUNTER — Encounter (HOSPITAL_COMMUNITY): Payer: Self-pay | Admitting: Emergency Medicine

## 2019-01-30 ENCOUNTER — Emergency Department (HOSPITAL_COMMUNITY)
Admission: EM | Admit: 2019-01-30 | Discharge: 2019-01-30 | Disposition: A | Discharge status: Home / Self Care | Payer: Self-pay | Attending: Emergency Medicine | Admitting: Emergency Medicine

## 2019-01-30 DIAGNOSIS — W108XXA Fall (on) (from) other stairs and steps, initial encounter: Secondary | ICD-10-CM | POA: Insufficient documentation

## 2019-01-30 DIAGNOSIS — S43015A Anterior dislocation of left humerus, initial encounter: Secondary | ICD-10-CM | POA: Insufficient documentation

## 2019-01-30 DIAGNOSIS — Y9301 Activity, walking, marching and hiking: Secondary | ICD-10-CM | POA: Insufficient documentation

## 2019-01-30 DIAGNOSIS — Y999 Unspecified external cause status: Secondary | ICD-10-CM | POA: Insufficient documentation

## 2019-01-30 DIAGNOSIS — Y929 Unspecified place or not applicable: Secondary | ICD-10-CM | POA: Insufficient documentation

## 2019-01-30 DIAGNOSIS — F17210 Nicotine dependence, cigarettes, uncomplicated: Secondary | ICD-10-CM | POA: Insufficient documentation

## 2019-01-30 DIAGNOSIS — S43005A Unspecified dislocation of left shoulder joint, initial encounter: Secondary | ICD-10-CM

## 2019-01-30 MED ORDER — PROPOFOL 10 MG/ML IV BOLUS
INTRAVENOUS | Status: AC
  Filled 2019-01-30: qty 20

## 2019-01-30 MED ORDER — MIDAZOLAM HCL 2 MG/2ML IJ SOLN
2.0000 mg | Freq: Once | INTRAMUSCULAR | Status: AC
  Administered 2019-01-30: 2 mg via INTRAVENOUS
  Filled 2019-01-30: qty 2

## 2019-01-30 MED ORDER — NAPROXEN 500 MG PO TABS: 500.0000 mg | ORAL_TABLET | Freq: Two times a day (BID) | ORAL | 0 refills | Status: AC

## 2019-01-30 MED ORDER — PROPOFOL 10 MG/ML IV BOLUS
1.0000 mg/kg | Freq: Once | INTRAVENOUS | Status: AC
  Administered 2019-01-30: 30 mg via INTRAVENOUS

## 2019-01-30 NOTE — Discharge Instructions (Signed)
Your testing showed a dislocated shoulder This has been fixed - no signs of broken bones Tylenol or motrin or Naproxyn as needed for pain See the orthopedic surgeon in 1 week. Use the sling for comfort for the next week with gentle stretching exercises. Ice and rest

## 2019-01-30 NOTE — ED Provider Notes (Signed)
[facility] EMERGENCY DEPARTMENT Provider Note   CSN: [id number] Arrival date & time: 01/30/19  2118     History Chief Complaint  Patient presents with  . Shoulder Injury    Caitlin Reyes is a 28 y.o. female.  HPI   This patient is a 28 year old female, she has a known history of prior arm surgery on the left but presents today after having a fall going down slippery steps landing on her left arm.  She states that she had immediate shoulder pain.  When the paramedics arrived they found the patient have a deformity of the left shoulder consistent with a possible dislocation and placed her in a sling and gave 100 mcg of fentanyl through an IV in the right antecubital fossa.  Her symptoms are persistent, worse with moving the arm, states that her arm is numb, denies a head injury neck pain or back pain.  Symptoms are persistent  Past Medical History:  Diagnosis Date  . Fracture of humeral shaft, left, closed 10/2016    There are no problems to display for this patient.   Past Surgical History:  Procedure Laterality Date  . NO PAST SURGERIES    . ORIF HUMERUS FRACTURE Left 10/19/2016   Procedure: OPEN REDUCTION INTERNAL FIXATION (ORIF) HUMERAL SHAFT FRACTURE;  Surgeon: Bjorn Pippin, MD;  Location: [facility];  Service: Orthopedics;  Laterality: Left;     OB History    Gravida  1   Para      Term      Preterm      AB      Living        SAB      TAB      Ectopic      Multiple      Live Births              No family history on file.  Social History   Tobacco Use  . Smoking status: Current Every Day Smoker    Years: 10.00    Types: Cigarettes  . Smokeless tobacco: Never Used  . Tobacco comment: 6 cig./day  Substance Use Topics  . Alcohol use: Yes    Comment: 2 x/week  . Drug use: No    Home Medications Prior to Admission medications   Medication Sig Start Date End Date Taking? Authorizing Provider  naproxen (NAPROSYN) 500 MG  tablet Take 1 tablet (500 mg total) by mouth 2 (two) times daily with a meal. 01/30/19   Eber Hong, MD  omeprazole (PRILOSEC) 20 MG capsule Take 1 capsule (20 mg total) by mouth daily. 10/19/16 11/02/16  Bjorn Pippin, MD    Allergies    Patient has no known allergies.  Review of Systems   Review of Systems  All other systems reviewed and are negative.   Physical Exam Updated Vital Signs BP (!) 167/109   Pulse 64   Temp 98 F (36.7 C)   Resp 20   Wt 61.7 kg   SpO2 100%   BMI 23.35 kg/m   Physical Exam Vitals and nursing note reviewed.  Constitutional:      General: She is not in acute distress.    Appearance: She is well-developed.  HENT:     Head: Normocephalic and atraumatic.     Mouth/Throat:     Pharynx: No oropharyngeal exudate.  Eyes:     General: No scleral icterus.       Right eye: No discharge.  Left eye: No discharge.     Conjunctiva/sclera: Conjunctivae normal.     Pupils: Pupils are equal, round, and reactive to light.  Neck:     Thyroid: No thyromegaly.     Vascular: No JVD.  Cardiovascular:     Rate and Rhythm: Normal rate and regular rhythm.     Heart sounds: Normal heart sounds. No murmur. No friction rub. No gallop.   Pulmonary:     Effort: Pulmonary effort is normal. No respiratory distress.     Breath sounds: Normal breath sounds. No wheezing or rales.  Abdominal:     General: Bowel sounds are normal. There is no distension.     Palpations: Abdomen is soft. There is no mass.     Tenderness: There is no abdominal tenderness.  Musculoskeletal:        General: Tenderness and deformity present. Normal range of motion.     Cervical back: Normal range of motion and neck supple.     Comments: There is an obvious deformity of the left shoulder consistent with possible anterior inferior shoulder dislocation.  Lymphadenopathy:     Cervical: No cervical adenopathy.  Skin:    General: Skin is warm and dry.     Findings: No erythema or rash.    Neurological:     Mental Status: She is alert.     Coordination: Coordination normal.     Comments: This patient has normal ability to move all 4 extremities except for left arm which seems to be immobilized, she will not move it and has a deformity at the shoulder  Psychiatric:        Behavior: Behavior normal.     ED Results / Procedures / Treatments   Labs (all labs ordered are listed, but only abnormal results are displayed) Labs Reviewed - No data to display  EKG None  Radiology DG Shoulder Left Portable  Result Date: 01/30/2019 CLINICAL DATA:  Status post reduction EXAM: LEFT SHOULDER COMPARISON:  01/30/2019 FINDINGS: The patient is status post reduction of the previously demonstrated left-sided glenohumeral dislocation. Again noted is plate screw fixation hardware involving diaphysis of the humerus. There is no obvious displaced fracture. IMPRESSION: Status post reduction of previously demonstrated left glenohumeral dislocation. No obvious displaced fracture. Electronically Signed   By: Katherine Mantlehristopher  Green M.D.   On: 01/30/2019 22:36   DG Shoulder Left Portable  Result Date: 01/30/2019 CLINICAL DATA:  Status post fall, shoulder dislocation EXAM: LEFT SHOULDER COMPARISON:  None. FINDINGS: Acute left anterior shoulder dislocation. No acute fracture. No aggressive osseous lesion. Partial visualized mid humeral diaphyseal side plate with interlocking screws. IMPRESSION: Acute left anterior shoulder dislocation. Electronically Signed   By: Elige KoHetal  Patel   On: 01/30/2019 22:02    Procedures .Sedation  Date/Time: 01/30/2019 9:53 PM Performed by: Eber HongMiller, Allexus Ovens, MD Authorized by: Eber HongMiller, Leoncio Hansen, MD   Consent:    Consent obtained:  Verbal   Consent given by:  Patient   Risks discussed:  Allergic reaction, dysrhythmia, inadequate sedation, nausea, prolonged hypoxia resulting in organ damage, prolonged sedation necessitating reversal, respiratory compromise necessitating ventilatory  assistance and intubation and vomiting   Alternatives discussed:  Analgesia without sedation, anxiolysis and regional anesthesia Universal protocol:    Procedure explained and questions answered to patient or proxy's satisfaction: yes     Relevant documents present and verified: yes     Test results available and properly labeled: yes     Imaging studies available: yes     Required blood products, implants,  devices, and special equipment available: yes     Site/side marked: yes     Immediately prior to procedure a time out was called: yes     Patient identity confirmation method:  Verbally with patient Indications:    Procedure performed:  Dislocation reduction   Procedure necessitating sedation performed by:  Physician performing sedation Pre-sedation assessment:    Time since last food or drink:  4 hours   ASA classification: class 1 - normal, healthy patient     Neck mobility: normal     Mouth opening:  3 or more finger widths   Thyromental distance:  4 finger widths   Mallampati score:  I - soft palate, uvula, fauces, pillars visible   Pre-sedation assessments completed and reviewed: airway patency, cardiovascular function, hydration status, mental status, nausea/vomiting, pain level, respiratory function and temperature     Pre-sedation assessment completed:  01/30/2019 9:30 PM Immediate pre-procedure details:    Reassessment: Patient reassessed immediately prior to procedure     Reviewed: vital signs, relevant labs/tests and NPO status     Verified: bag valve mask available, emergency equipment available, intubation equipment available, IV patency confirmed, oxygen available and suction available   Procedure details (see MAR for exact dosages):    Preoxygenation:  Nasal cannula   Sedation:  Propofol and midazolam   Intended level of sedation: deep   Intra-procedure monitoring:  Blood pressure monitoring, cardiac monitor, continuous pulse oximetry, frequent LOC assessments,  frequent vital sign checks and continuous capnometry   Intra-procedure events: none     Total Provider sedation time (minutes):  15 Post-procedure details:    Post-sedation assessment completed:  01/30/2019 9:54 PM   Attendance: Constant attendance by certified staff until patient recovered     Recovery: Patient returned to pre-procedure baseline     Post-sedation assessments completed and reviewed: airway patency, cardiovascular function, hydration status, mental status, nausea/vomiting, pain level, respiratory function and temperature     Patient is stable for discharge or admission: yes     Patient tolerance:  Tolerated well, no immediate complications Comments:     Sedation began at 9:38 PM, ended at 9:54 PM, patient returned to baseline mental status gradually, maintaining airway and had no difficulty with breathing, apnea or desaturations.  Reduction of dislocation  Date/Time: 01/30/2019 9:55 PM Performed by: Eber Hong, MD Authorized by: Eber Hong, MD  Consent: Verbal consent obtained. Written consent obtained. Risks and benefits: risks, benefits and alternatives were discussed Consent given by: patient Patient understanding: patient states understanding of the procedure being performed Patient consent: the patient's understanding of the procedure matches consent given Required items: required blood products, implants, devices, and special equipment available Patient identity confirmed: verbally with patient Time out: Immediately prior to procedure a "time out" was called to verify the correct patient, procedure, equipment, support staff and site/side marked as required. Preparation: Patient was prepped and draped in the usual sterile fashion. Local anesthesia used: no  Anesthesia: Local anesthesia used: no  Sedation: Patient sedated: yes  Patient tolerance: patient tolerated the procedure well with no immediate complications Comments: With manipulation of the  shoulder this was very easily reduced once the patient was adequately sedated.  Good pulses post reduction. The patient was placed in a sling by the technician    (including critical care time)  Medications Ordered in ED Medications  midazolam (VERSED) injection 2 mg (2 mg Intravenous Given 01/30/19 2135)  propofol (DIPRIVAN) 10 mg/mL bolus/IV push 1 mg/kg (30 mg Intravenous Given 01/30/19  2148)    ED Course  I have reviewed the triage vital signs and the nursing notes.  Pertinent labs & imaging results that were available during my care of the patient were reviewed by me and considered in my medical decision making (see chart for details).    MDM Rules/Calculators/A&P                      Patient is otherwise well-appearing but has an obvious deformity of the left shoulder, ordered an x-ray, will need sedation for reduction of dislocated.  The patient has given her verbal and written agreement.  Postreduction films ordered  Postreduction films show good alignment of the shoulder, thankfully there is no signs of fractures in the postreduction films.  The patient was able to ambulate out of the emergency department once the sedation wore off with her normal mental status understanding her discharge instructions.  She was placed in a sling by nursing staff prior to discharge and had good pulses and sensation afterwards based on my exam.  Final Clinical Impression(s) / ED Diagnoses Final diagnoses:  Shoulder dislocation, left, initial encounter    Rx / DC Orders ED Discharge Orders         Ordered    naproxen (NAPROSYN) 500 MG tablet  2 times daily with meals     01/30/19 2157           Noemi Chapel, MD 02/01/19 [phone]

## 2019-01-30 NOTE — ED Triage Notes (Signed)
Pt states she tripped and fell onto her left shoulder. Shoulder has obvious deformity.

## 2019-03-09 ENCOUNTER — Encounter (HOSPITAL_COMMUNITY): Payer: Self-pay | Admitting: Emergency Medicine

## 2019-03-09 ENCOUNTER — Emergency Department (HOSPITAL_COMMUNITY)
Admission: EM | Admit: 2019-03-09 | Discharge: 2019-03-09 | Disposition: A | Discharge status: Home / Self Care | Payer: Self-pay | Attending: Emergency Medicine | Admitting: Emergency Medicine

## 2019-03-09 ENCOUNTER — Other Ambulatory Visit: Payer: Self-pay

## 2019-03-09 DIAGNOSIS — G8929 Other chronic pain: Secondary | ICD-10-CM | POA: Insufficient documentation

## 2019-03-09 DIAGNOSIS — M25512 Pain in left shoulder: Secondary | ICD-10-CM | POA: Insufficient documentation

## 2019-03-09 DIAGNOSIS — F17210 Nicotine dependence, cigarettes, uncomplicated: Secondary | ICD-10-CM | POA: Insufficient documentation

## 2019-03-09 LAB — CBC WITH DIFFERENTIAL/PLATELET
Abs Immature Granulocytes: 0.03 10*3/uL (ref 0.00–0.07)
Basophils Absolute: 0 10*3/uL (ref 0.0–0.1)
Basophils Relative: 0 %
Eosinophils Absolute: 0.1 10*3/uL (ref 0.0–0.5)
Eosinophils Relative: 1 %
HCT: 43.2 % (ref 36.0–46.0)
Hemoglobin: 14.2 g/dL (ref 12.0–15.0)
Immature Granulocytes: 0 %
Lymphocytes Relative: 13 %
Lymphs Abs: 1.5 10*3/uL (ref 0.7–4.0)
MCH: 30.5 pg (ref 26.0–34.0)
MCHC: 32.9 g/dL (ref 30.0–36.0)
MCV: 92.9 fL (ref 80.0–100.0)
Monocytes Absolute: 1 10*3/uL (ref 0.1–1.0)
Monocytes Relative: 8 %
Neutro Abs: 8.8 10*3/uL — ABNORMAL HIGH (ref 1.7–7.7)
Neutrophils Relative %: 78 %
Platelets: 373 10*3/uL (ref 150–400)
RBC: 4.65 MIL/uL (ref 3.87–5.11)
RDW: 13.7 % (ref 11.5–15.5)
WBC: 11.5 10*3/uL — ABNORMAL HIGH (ref 4.0–10.5)
nRBC: 0 % (ref 0.0–0.2)

## 2019-03-09 LAB — BASIC METABOLIC PANEL
Anion gap: 9 (ref 5–15)
BUN: 18 mg/dL (ref 6–20)
CO2: 28 mmol/L (ref 22–32)
Calcium: 9.5 mg/dL (ref 8.9–10.3)
Chloride: 103 mmol/L (ref 98–111)
Creatinine, Ser: 0.99 mg/dL (ref 0.44–1.00)
GFR calc Af Amer: >60 mL/min (ref >60)
GFR calc non Af Amer: >60 mL/min (ref >60)
Glucose, Bld: 104 mg/dL — ABNORMAL HIGH (ref 70–99)
Potassium: 3.8 mmol/L (ref 3.5–5.1)
Sodium: 140 mmol/L (ref 135–145)

## 2019-03-09 MED ORDER — DICLOFENAC SODIUM 75 MG PO TBEC: 75.0000 mg | DELAYED_RELEASE_TABLET | Freq: Two times a day (BID) | ORAL | 0 refills | Status: AC

## 2019-03-09 MED ORDER — METHOCARBAMOL 500 MG PO TABS: 500.0000 mg | ORAL_TABLET | Freq: Two times a day (BID) | ORAL | 0 refills | Status: AC

## 2019-03-09 NOTE — ED Notes (Signed)
Pt reports she has HTN but takes no meds for it

## 2019-03-09 NOTE — ED Provider Notes (Signed)
[facility] EMERGENCY DEPARTMENT Provider Note   CSN: [id number] Arrival date & time: 03/09/19  1140     History Chief Complaint  Patient presents with  . Multiple complaints    Caitlin Reyes is a 29 y.o. female.  Pt complains of continued pain in her left shoulder after dislocation on 12/30.  Pt reports she followed up with Orthopaedist who started her on gabapentin. Pt reports it does not help.  Pt states she also has had a poor appetite.  Pt reports she goes days without eating because she does not feel like it. Pt reports she passed out last week after not eating or drinking. Pt denies fever or chills, no cough, no nausea  The history is provided by the patient. No language interpreter was used.       Past Medical History:  Diagnosis Date  . Fracture of humeral shaft, left, closed 10/2016    There are no problems to display for this patient.   Past Surgical History:  Procedure Laterality Date  . NO PAST SURGERIES    . ORIF HUMERUS FRACTURE Left 10/19/2016   Procedure: OPEN REDUCTION INTERNAL FIXATION (ORIF) HUMERAL SHAFT FRACTURE;  Surgeon: Bjorn Pippin, MD;  Location: [facility];  Service: Orthopedics;  Laterality: Left;     OB History    Gravida  1   Para      Term      Preterm      AB      Living        SAB      TAB      Ectopic      Multiple      Live Births              No family history on file.  Social History   Tobacco Use  . Smoking status: Current Every Day Smoker    Packs/day: 0.50    Years: 10.00    Pack years: 5.00    Types: Cigarettes  . Smokeless tobacco: Never Used  . Tobacco comment: 6 cig./day  Substance Use Topics  . Alcohol use: Yes    Comment: 2 x/week  . Drug use: No    Home Medications Prior to Admission medications   Medication Sig Start Date End Date Taking? Authorizing Provider  acetaminophen (TYLENOL) 500 MG tablet Take 1,000 mg by mouth every 6 (six) hours as needed for moderate pain  or headache.   Yes [provider]  gabapentin (NEURONTIN) 300 MG capsule Take 300 mg by mouth 3 (three) times daily. 02/19/19  Yes [provider]  ibuprofen (ADVIL) 200 MG tablet Take 400 mg by mouth every 6 (six) hours as needed for moderate pain.   Yes [provider]  naproxen (NAPROSYN) 500 MG tablet Take 1 tablet (500 mg total) by mouth 2 (two) times daily with a meal. Patient taking differently: Take 500 mg by mouth 2 (two) times daily as needed.  01/30/19  Yes Eber Hong, MD    Allergies    Patient has no known allergies.  Review of Systems   Review of Systems  Constitutional: Positive for appetite change and fatigue.  Musculoskeletal: Positive for arthralgias.  All other systems reviewed and are negative.   Physical Exam Updated Vital Signs BP (!) 139/100 (BP Location: Right Arm)   Pulse (!) 114   Temp 98.3 F (36.8 C) (Oral)   Resp 14   Ht 5\' 4"  (1.626 m)   Wt 56.7 kg  LMP 02/23/2019   SpO2 98%   BMI 21.46 kg/m   Physical Exam Vitals and nursing note reviewed.  Constitutional:      Appearance: She is well-developed.  HENT:     Head: Normocephalic.  Cardiovascular:     Rate and Rhythm: Normal rate.  Pulmonary:     Effort: Pulmonary effort is normal.  Abdominal:     General: Abdomen is flat. There is no distension.  Musculoskeletal:        General: Tenderness present.     Cervical back: Normal range of motion.  Skin:    General: Skin is warm.  Neurological:     General: No focal deficit present.     Mental Status: She is alert and oriented to person, place, and time.     ED Results / Procedures / Treatments   Labs (all labs ordered are listed, but only abnormal results are displayed) Labs Reviewed  CBC WITH DIFFERENTIAL/PLATELET - Abnormal; Notable for the following components:      Result Value   WBC 11.5 (*)    Neutro Abs 8.8 (*)    All other components within normal limits  BASIC METABOLIC PANEL - Abnormal;  Notable for the following components:   Glucose, Bld 104 (*)    All other components within normal limits    EKG None  Radiology No results found.  Procedures Procedures (including critical care time)  Medications Ordered in ED Medications - No data to display  ED Course  I have reviewed the triage vital signs and the nursing notes.  Pertinent labs & imaging results that were available during my care of the patient were reviewed by me and considered in my medical decision making (see chart for details).    MDM Rules/Calculators/A&P                      MDM: cbc and bmet are normal.  Pt is eating and drinking during evaluation.  Pt advised to eat and drink.  Follow up with primary, orthopaedist and follow up with mental heatlh for anxiety Final Clinical Impression(s) / ED Diagnoses Final diagnoses:  Chronic left shoulder pain    Rx / DC Orders ED Discharge Orders    None    An After Visit Summary was printed and given to the patient.   Fransico Meadow, PA-C 03/09/19 1342    Fredia Sorrow, MD 03/10/19 [phone]

## 2019-03-09 NOTE — ED Notes (Signed)
Here with multiple complaints regarding a shoulder injury from the end of the year   Has  Seen Dr "Eduard Roux" at [facility] who prescribed her gabapentin but she reports it does not help and she is to see him this month but has not spoken to related to continued pain and discomfort   She reports she cannot sleep and has pain

## 2019-03-09 NOTE — ED Triage Notes (Signed)
Pt c/o that she dislocated her left shoulder over a month ago and it is not getting better. She is having weakness, panic attacks, she is not eating, and she does not feel good.

## 2021-10-24 IMAGING — DX DG SHOULDER 1V*L*
2 series · 3 of 3 positions shown · non-contrast
Comparison: 01/30/2019

CLINICAL DATA: Status post reduction

EXAM:
LEFT SHOULDER

[Series 1: shoulder ap · 0.14mm/px · 2 of 2 slices shown]
[im 1/2]
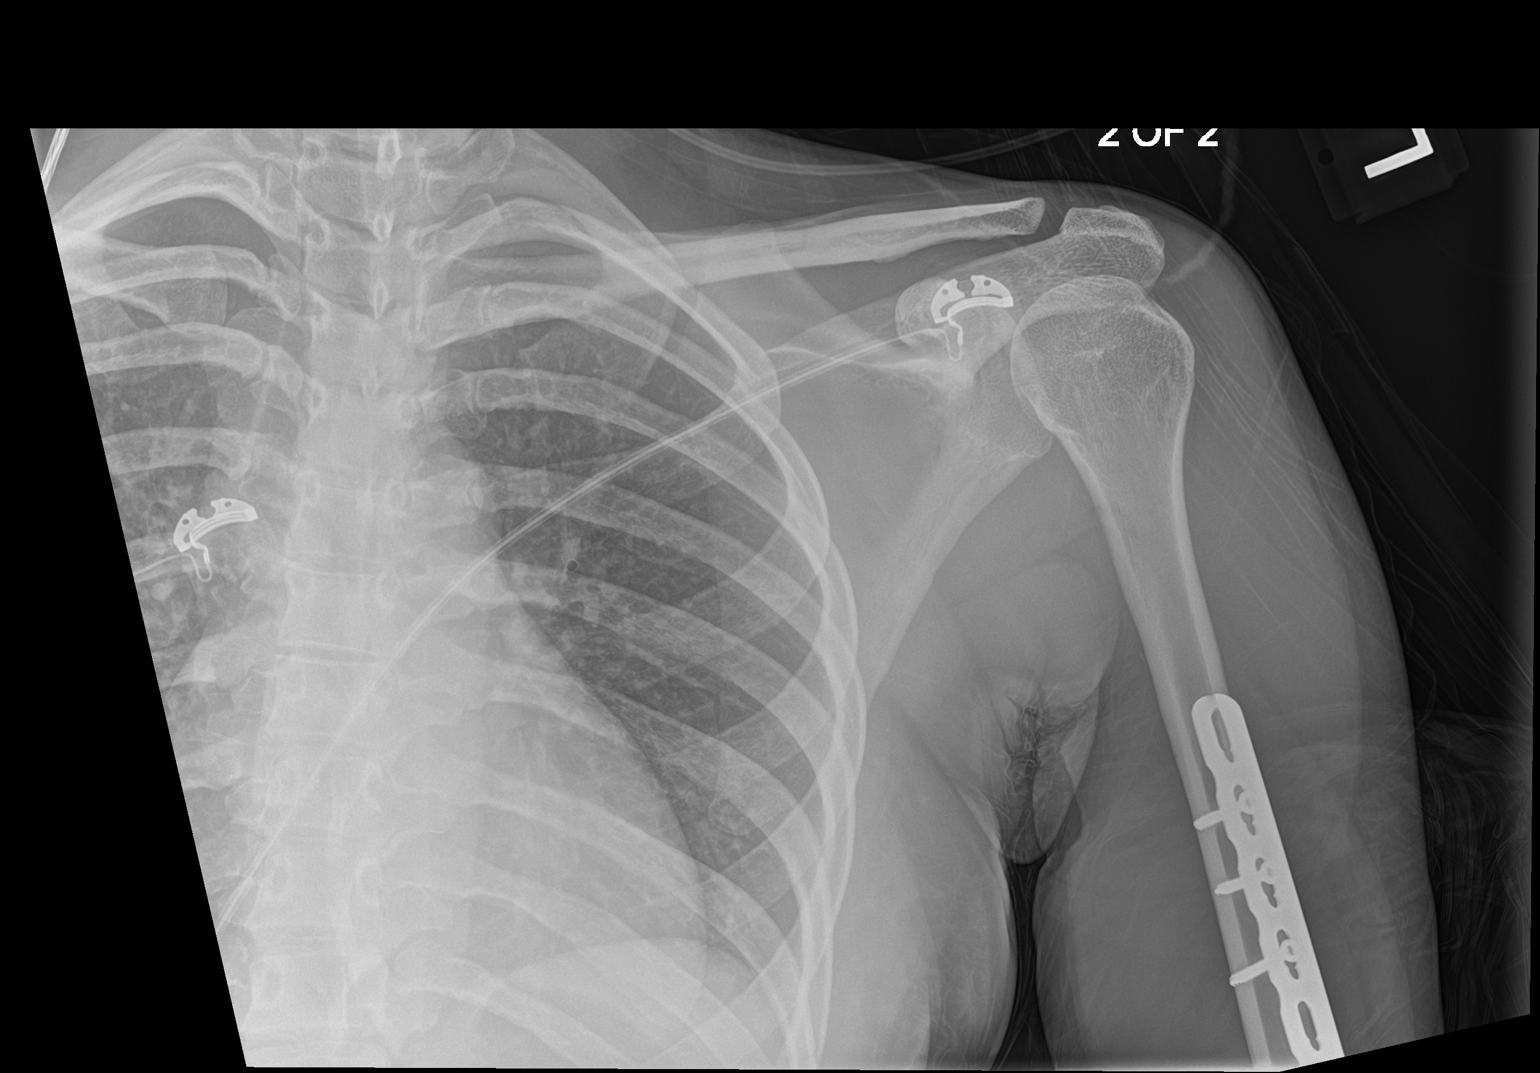
[im 2/2]
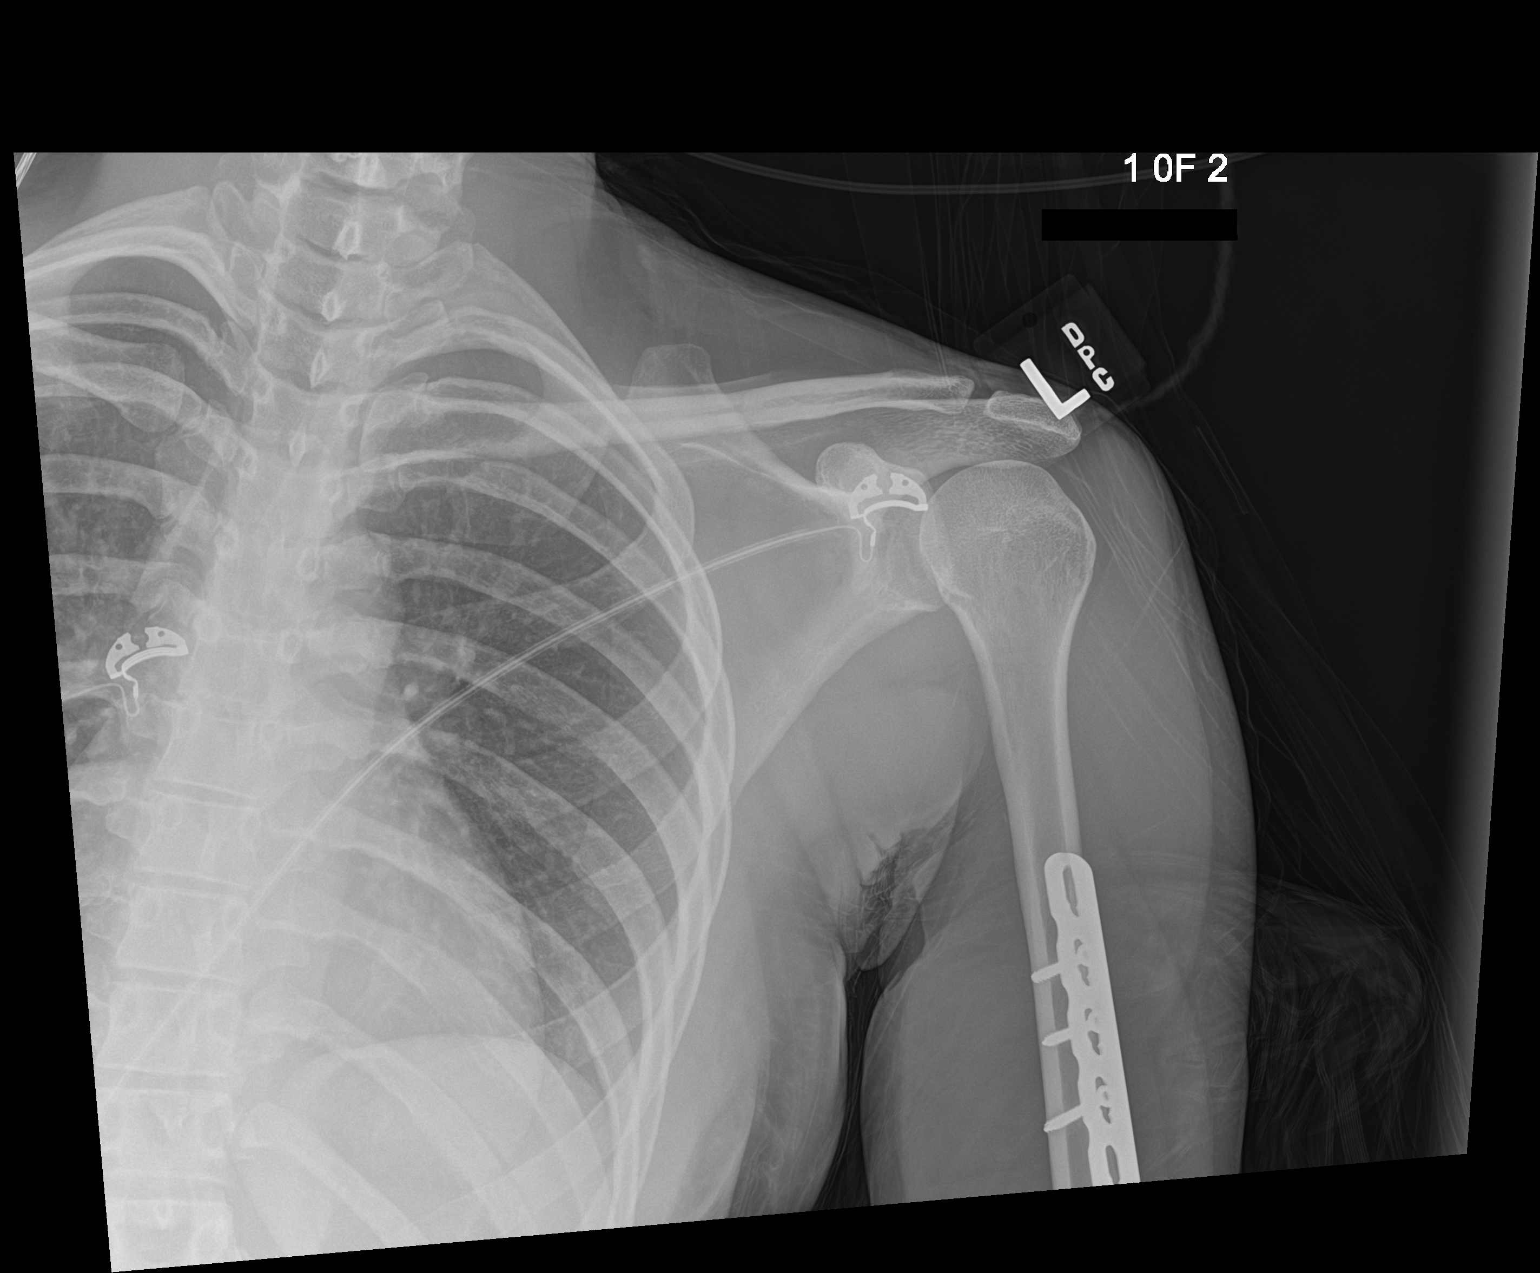

[scapula lat]
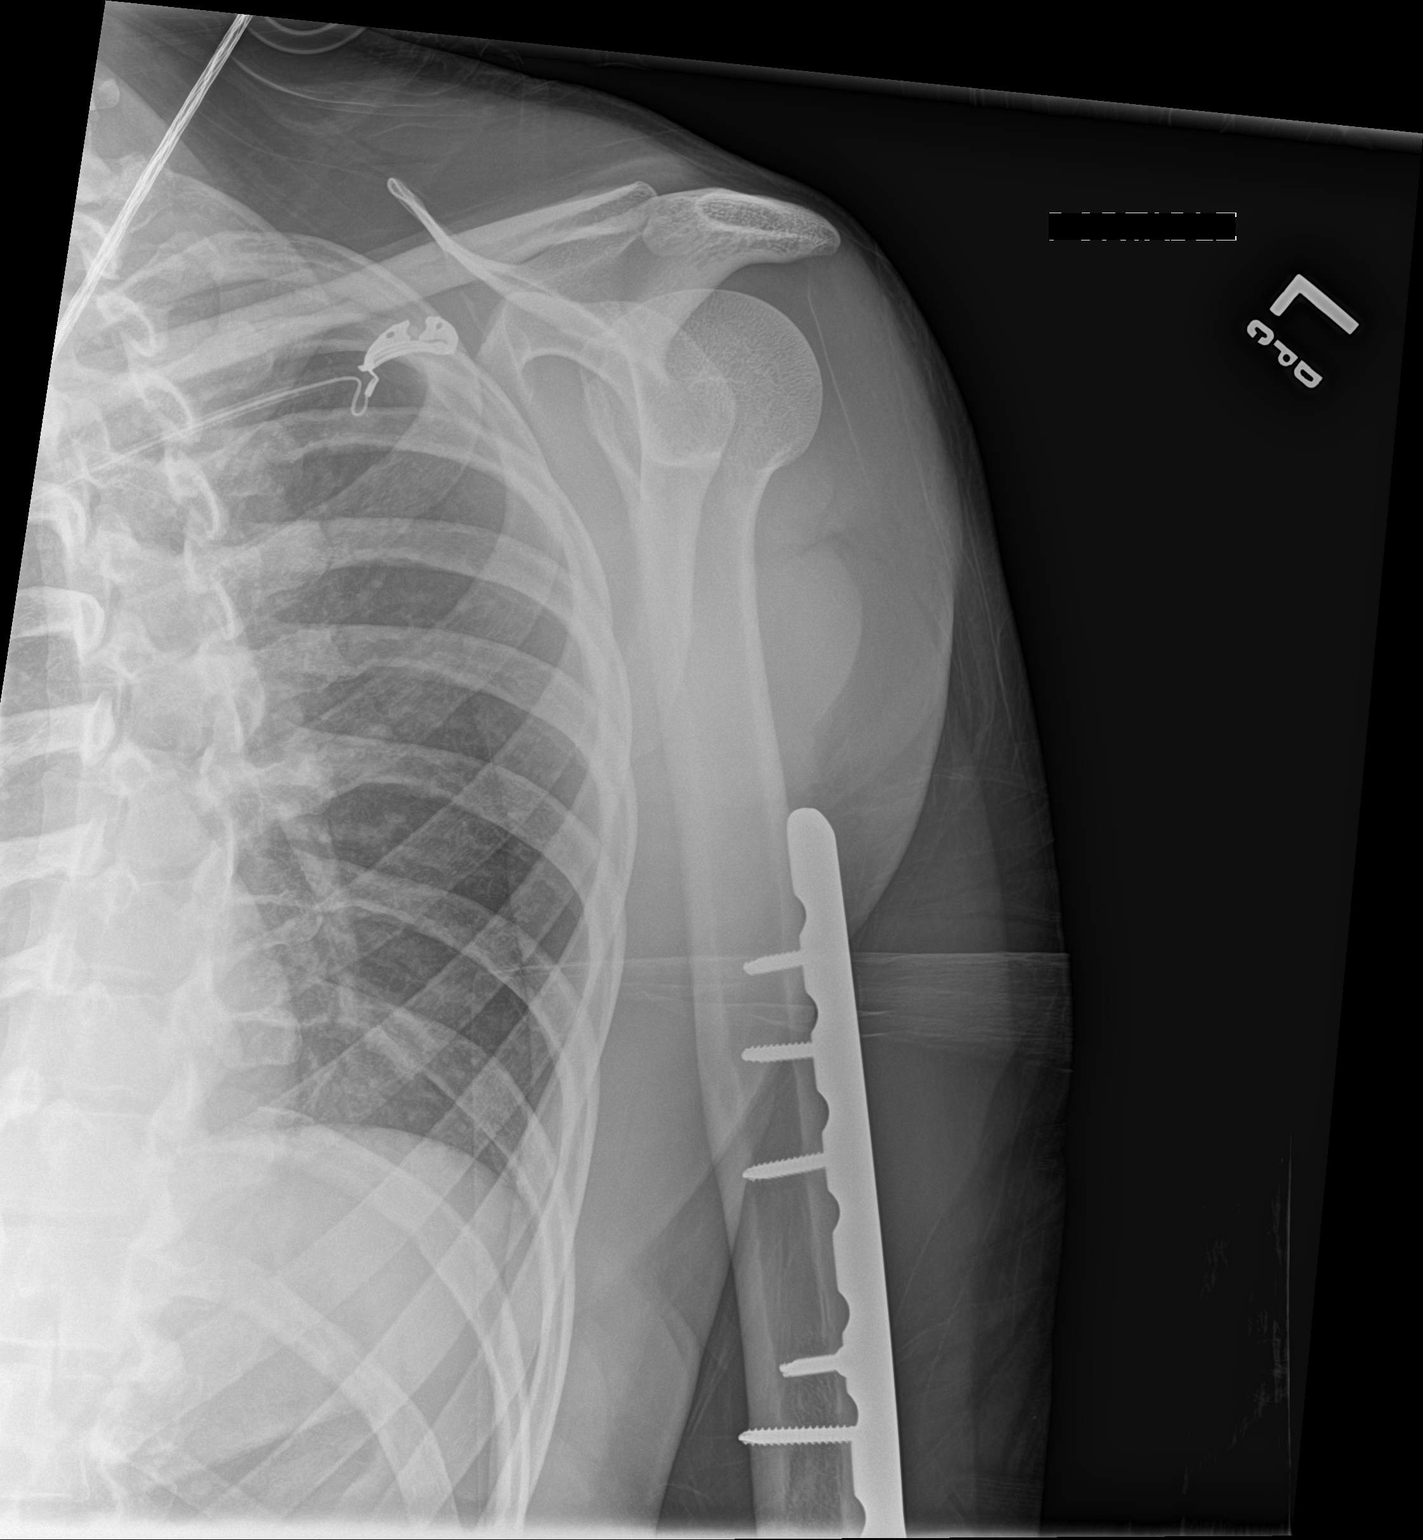

[3 of 3 positions shown; findings below may reference images not displayed]

FINDINGS: The patient is status post reduction of the previously demonstrated
left-sided glenohumeral dislocation. Again noted is plate screw
fixation hardware involving diaphysis of the humerus. There is no
obvious displaced fracture.
IMPRESSION: Status post reduction of previously demonstrated left glenohumeral
dislocation. No obvious displaced fracture.
